# Patient Record
Sex: Female | Born: 1950 | Race: White | Hispanic: No | Smoking: Current every day smoker
Health system: Southern US, Community
[De-identification: ages and names within clinical notes are randomized; demographics above are authoritative.]

## PROBLEM LIST (undated history)

## (undated) DIAGNOSIS — J45909 Unspecified asthma, uncomplicated: Secondary | ICD-10-CM

## (undated) HISTORY — PX: TONSILLECTOMY: SUR1361

## (undated) HISTORY — PX: HIP ARTHROPLASTY: SHX981

## (undated) HISTORY — PX: ABDOMINAL HYSTERECTOMY: SHX81

---

## 2017-06-26 HISTORY — PX: COLONOSCOPY W/ BIOPSIES: SHX1374

## 2017-07-01 ENCOUNTER — Encounter: Payer: Self-pay | Admitting: *Deleted

## 2017-07-01 ENCOUNTER — Emergency Department (INDEPENDENT_AMBULATORY_CARE_PROVIDER_SITE_OTHER): Payer: Medicare Other

## 2017-07-01 ENCOUNTER — Emergency Department
Admission: EM | Admit: 2017-07-01 | Discharge: 2017-07-01 | Disposition: A | Discharge status: Home / Self Care | Payer: Medicare Other | Source: Home / Self Care | Attending: Family Medicine | Admitting: Family Medicine

## 2017-07-01 DIAGNOSIS — X58XXXA Exposure to other specified factors, initial encounter: Secondary | ICD-10-CM

## 2017-07-01 DIAGNOSIS — S2231XA Fracture of one rib, right side, initial encounter for closed fracture: Secondary | ICD-10-CM

## 2017-07-01 HISTORY — DX: Unspecified asthma, uncomplicated: J45.909

## 2017-07-01 NOTE — Discharge Instructions (Signed)
Apply ice pack for 20 to 30 minutes, 3 to 4 times daily  Continue until pain and swelling decrease.  For pain, may take Ibuprofen 200mg , 4 tabs every 8 hours with food.  Wear rib belt as needed, but not constantly.

## 2017-07-01 NOTE — ED Triage Notes (Signed)
Patient c/o 4 days of right sided rib pain. The previous day she had a colonoscopy & polyp removal. She has had a cough for 2-3 years. Repots she is having "testing for COPD". She contacted her GI doctor who advised her to be seen for the rib pain @ urgent care or ER.

## 2017-07-01 NOTE — ED Provider Notes (Signed)
[facility]    CSN: [id number] Arrival date & time: 07/01/17  1309     History   Chief Complaint Chief Complaint  Patient presents with  . Chest Pain    HPI Charlotte Robinson is a 66 y.o. female.   Patient complains of pain in her right anterior/lateral ribs for 4 days, worse with inspiration, cough and movement.  She recalls no injury.  She had had a colonoscopy and polyp removal the day prior.  She has a chronic cough, and notes recent extensive evaluation about two weeks ago in her home state of [place].  She had had chest X-ray and CT chest that were negative.  She feels well otherwise.  No fevers, chills, and sweats.  No shortness of breath.  No leg pain or swelling.      Past Medical History:  Diagnosis Date  . Asthma     There are no active problems to display for this patient.   Past Surgical History:  Procedure Laterality Date  . ABDOMINAL HYSTERECTOMY    . CESAREAN SECTION    . COLONOSCOPY W/ BIOPSIES  06/26/2017  . HIP ARTHROPLASTY    . TONSILLECTOMY      OB History    No data available       Home Medications    Prior to Admission medications   Medication Sig Start Date End Date Taking? Authorizing Provider  albuterol (PROVENTIL HFA;VENTOLIN HFA) 108 (90 Base) MCG/ACT inhaler Inhale into the lungs every 6 (six) hours as needed for wheezing or shortness of breath.   Yes [provider]  Calcium Carb-Cholecalciferol (CALCIUM 1000 + D PO) Take by mouth.   Yes [provider]    Family History History reviewed. No pertinent family history.  Social History Social History  Substance Use Topics  . Smoking status: Current Every Day Smoker    Packs/day: 1.50    Types: Cigarettes  . Smokeless tobacco: Never Used  . Alcohol use No     Allergies   Penicillins   Review of Systems Review of Systems No sore throat + cough + pleuritic pain No wheezing No nasal congestion No post-nasal drainage No sinus pain/pressure No  itchy/red eyes No earache No hemoptysis No SOB No fever/chills No nausea No vomiting No abdominal pain No diarrhea No urinary symptoms No skin rash + fatigue No myalgias No headache Used OTC meds without relief (ibuprofen)   Physical Exam Triage Vital Signs ED Triage Vitals  Enc Vitals Group     BP 07/01/17 1335 102/68     Pulse Rate 07/01/17 1335 86     Resp 07/01/17 1335 16     Temp 07/01/17 1335 98.6 F (37 C)     Temp Source 07/01/17 1335 Oral     SpO2 07/01/17 1335 94 %     Weight 07/01/17 1336 110 lb (49.9 kg)     Height 07/01/17 1336 5' 0.5" (1.537 m)     Head Circumference --      Peak Flow --      Pain Score 07/01/17 1336 7     Pain Loc --      Pain Edu? --      Excl. in GC? --    No data found.   Updated Vital Signs BP 102/68 (BP Location: Left Arm)   Pulse 86   Temp 98.6 F (37 C) (Oral)   Resp 16   Ht 5' 0.5" (1.537 m)   Wt 110 lb (49.9 kg)  SpO2 94%   BMI 21.13 kg/m   Visual Acuity Right Eye Distance:   Left Eye Distance:   Bilateral Distance:    Right Eye Near:   Left Eye Near:    Bilateral Near:     Physical Exam  Constitutional: She appears well-developed and well-nourished. No distress.  HENT:  Right Ear: External ear normal.  Left Ear: External ear normal.  Nose: Nose normal.  Mouth/Throat: Oropharynx is clear and moist.  Eyes: Pupils are equal, round, and reactive to light. Conjunctivae are normal.  Neck: Neck supple.  Cardiovascular: Normal heart sounds.   Pulmonary/Chest: Effort normal and breath sounds normal. No respiratory distress. She has no wheezes. She has no rales.     She exhibits tenderness.    Abdominal: Soft. There is tenderness.  Lymphadenopathy:    She has no cervical adenopathy.  Neurological: She is alert.  Skin: Skin is warm and dry. No rash noted.  Nursing note and vitals reviewed.    UC Treatments / Results  Labs (all labs ordered are listed, but only abnormal results are displayed) Labs  Reviewed - No data to display  EKG  EKG Interpretation None       Radiology Dg Ribs Unilateral W/chest Right  Result Date: 07/01/2017 CLINICAL DATA:  Pain for 4 days EXAM: RIGHT RIBS AND CHEST - 3+ VIEW COMPARISON:  None. FINDINGS: Frontal chest as well as oblique and cone-down lower rib images obtained. Lungs are clear. Heart size and pulmonary vascularity are normal. No adenopathy. No pneumothorax or pleural effusion. There is a slightly displaced fracture of the anterior right ninth rib. No other fracture. No dislocation. There is aortic atherosclerosis. IMPRESSION: The displaced fracture anterior right ninth rib. No pneumothorax or pleural effusion. Lungs clear. There is aortic atherosclerosis. Aortic Atherosclerosis (ICD10-I70.0). Electronically Signed   By: Bretta BangWilliam  Woodruff III M.D.   On: 07/01/2017 14:03    Procedures Procedures (including critical care time)  Medications Ordered in UC Medications - No data to display   Initial Impression / Assessment and Plan / UC Course  I have reviewed the triage vital signs and the nursing notes.  Pertinent labs & imaging results that were available during my care of the patient were reviewed by me and considered in my medical decision making (see chart for details).    Dispensed rib belt. Apply ice pack for 20 to 30 minutes, 3 to 4 times daily  Continue until pain and swelling decrease.  For pain, may take Ibuprofen 200mg , 4 tabs every 8 hours with food.  Wear rib belt as needed, but not constantly. If symptoms become significantly worse during the night or over the weekend, proceed to the local emergency room.     Final Clinical Impressions(s) / UC Diagnoses   Final diagnoses:  Closed fracture of one rib of right side, initial encounter    New Prescriptions New Prescriptions   No medications on file     Lattie HawBeese, Leea Rambeau A, MD 07/01/17 1428

## 2018-12-02 IMAGING — DX DG RIBS W/ CHEST 3+V*R*
3 series · 3 of 3 positions shown · non-contrast
Comparison: None.

CLINICAL DATA: Pain for 4 days

EXAM:
RIGHT RIBS AND CHEST - 3+ VIEW

[chest pa]
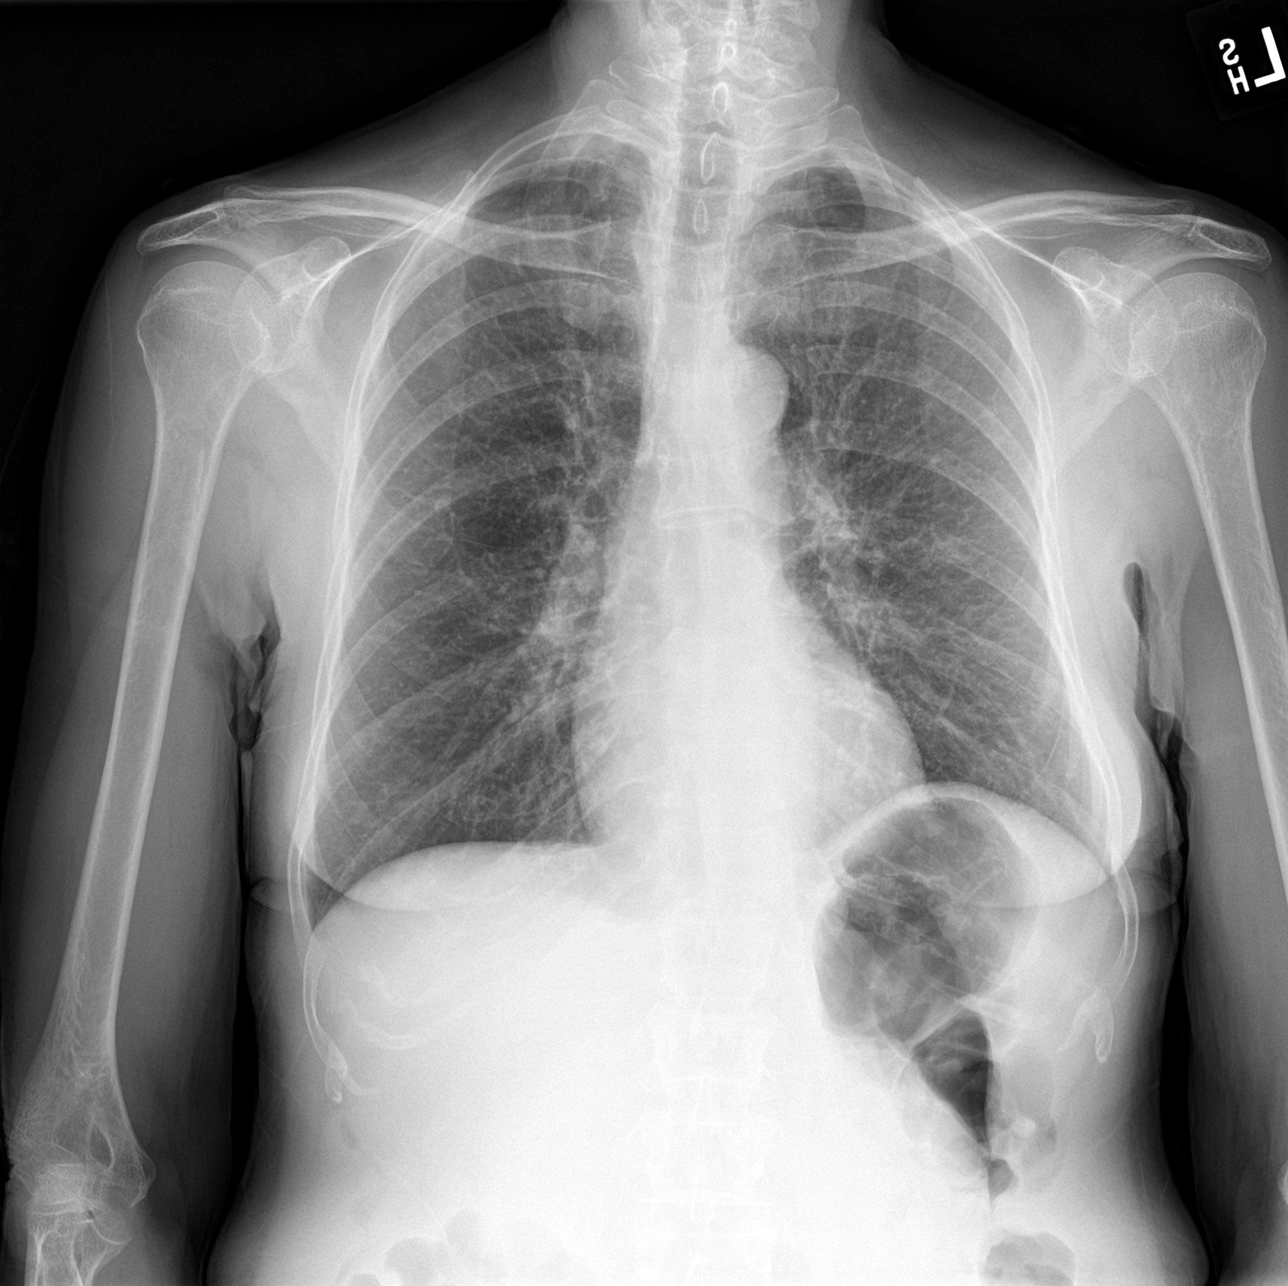

[rib pa]
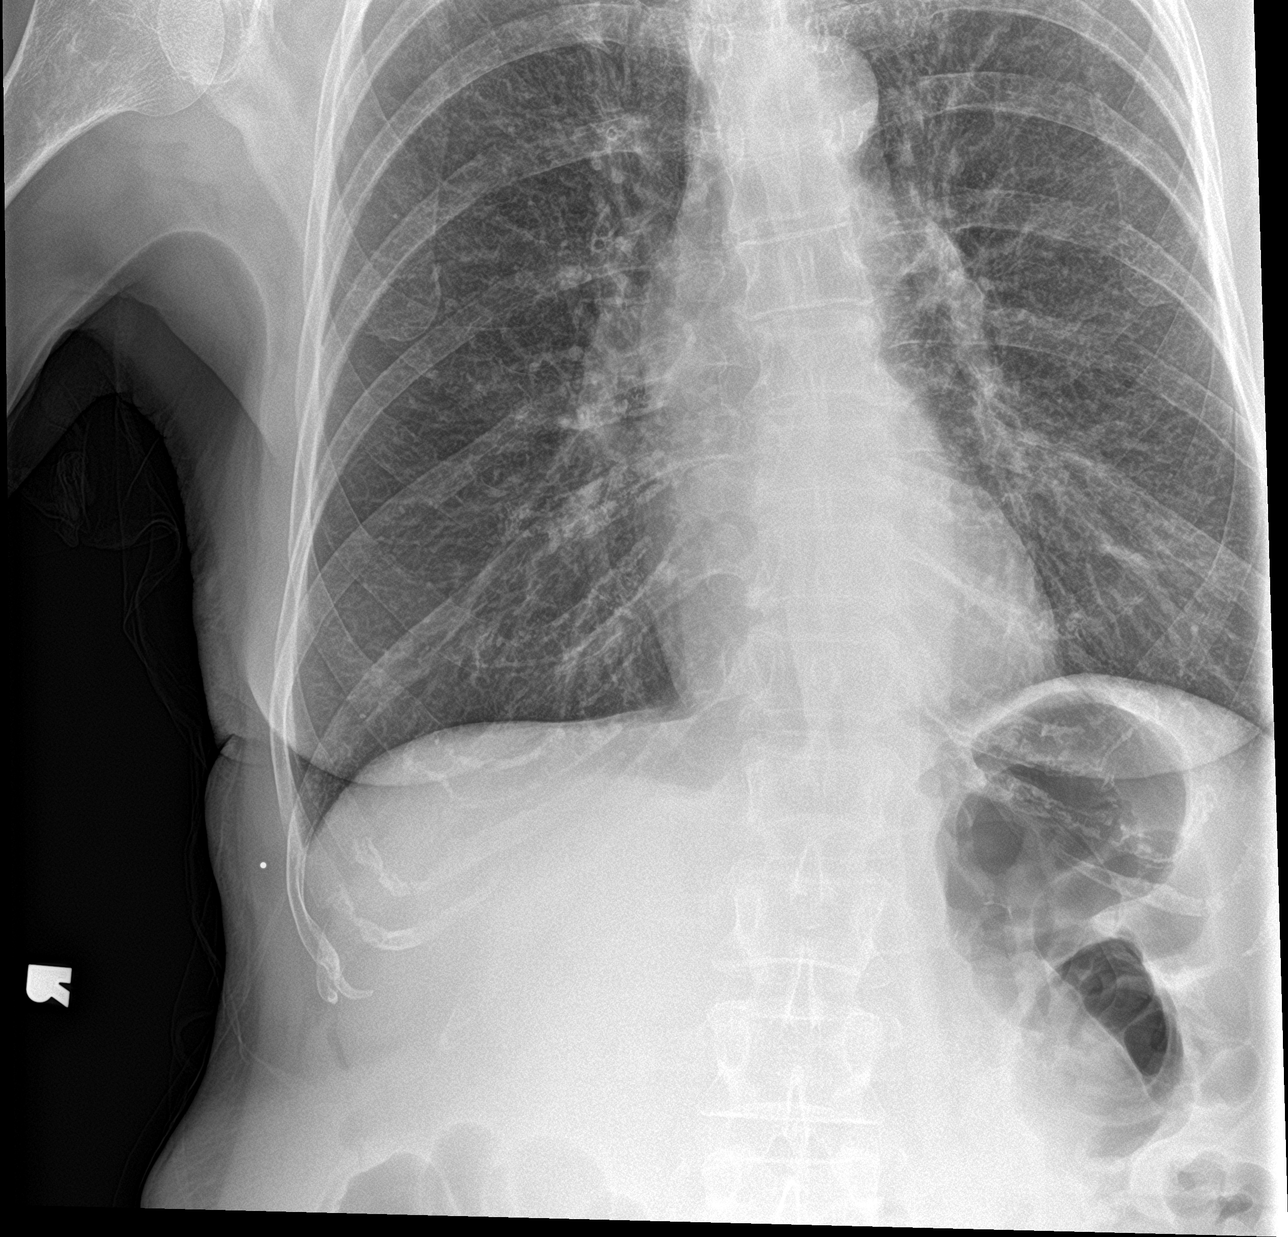

[rib pa obl]
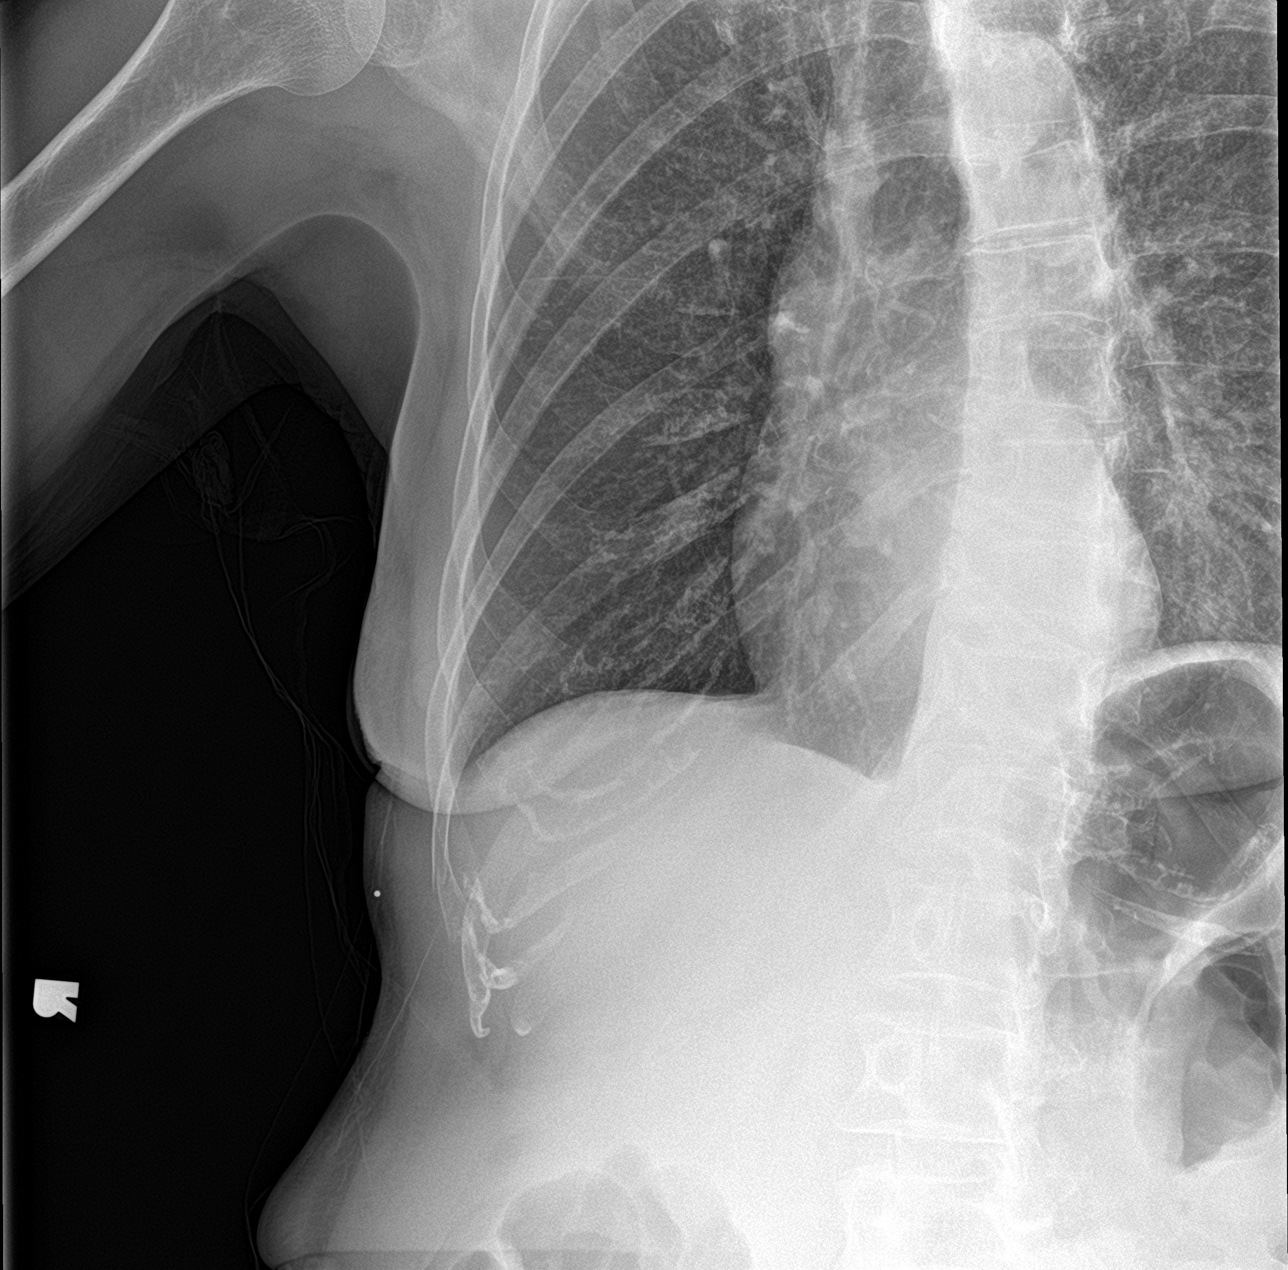

[3 of 3 positions shown; findings below may reference images not displayed]

FINDINGS: Frontal chest as well as oblique and cone-down lower rib images
obtained. Lungs are clear. Heart size and pulmonary vascularity are
normal. No adenopathy. No pneumothorax or pleural effusion. There is
a slightly displaced fracture of the anterior right ninth rib. No
other fracture. No dislocation. There is aortic atherosclerosis.
IMPRESSION: The displaced fracture anterior right ninth rib. No pneumothorax or
pleural effusion. Lungs clear. There is aortic atherosclerosis.

Aortic Atherosclerosis ([id number].H).
